# Patient Record
Sex: Male | Born: 2005 | Hispanic: No | Marital: Single | Smoking: Never smoker
Health system: Southern US, Community
[De-identification: ages and names within clinical notes are randomized; demographics above are authoritative.]

## PROBLEM LIST (undated history)

## (undated) DIAGNOSIS — F909 Attention-deficit hyperactivity disorder, unspecified type: Secondary | ICD-10-CM

## (undated) DIAGNOSIS — T4145XA Adverse effect of unspecified anesthetic, initial encounter: Secondary | ICD-10-CM

## (undated) DIAGNOSIS — F840 Autistic disorder: Secondary | ICD-10-CM

## (undated) DIAGNOSIS — T8859XA Other complications of anesthesia, initial encounter: Secondary | ICD-10-CM

## (undated) DIAGNOSIS — F32A Depression, unspecified: Secondary | ICD-10-CM

## (undated) DIAGNOSIS — J45909 Unspecified asthma, uncomplicated: Secondary | ICD-10-CM

## (undated) DIAGNOSIS — K5900 Constipation, unspecified: Secondary | ICD-10-CM

## (undated) HISTORY — PX: TONSILECTOMY, ADENOIDECTOMY, BILATERAL MYRINGOTOMY AND TUBES: SHX2538

## (undated) HISTORY — PX: STRABISMUS SURGERY: SHX218

## (undated) HISTORY — PX: OTHER SURGICAL HISTORY: SHX169

## (undated) HISTORY — DX: Depression, unspecified: F32.A

---
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
## 2023-02-26 ENCOUNTER — Ambulatory Visit (INDEPENDENT_AMBULATORY_CARE_PROVIDER_SITE_OTHER): Payer: Self-pay | Admitting: Psychiatry

## 2023-02-26 DIAGNOSIS — F840 Autistic disorder: Secondary | ICD-10-CM

## 2023-02-27 NOTE — Progress Notes (Signed)
No show

## 2023-06-10 ENCOUNTER — Encounter: Payer: Self-pay | Admitting: Physician Assistant

## 2023-06-10 ENCOUNTER — Ambulatory Visit: Payer: Self-pay | Admitting: Physician Assistant

## 2023-06-10 VITALS — BP 130/88 | HR 75 | Temp 98.2°F | Ht 65.25 in | Wt 126.4 lb

## 2023-06-10 DIAGNOSIS — R0989 Other specified symptoms and signs involving the circulatory and respiratory systems: Secondary | ICD-10-CM

## 2023-06-10 MED ORDER — IPRATROPIUM BROMIDE 0.03 % NA SOLN: 2.0000 | Freq: Two times a day (BID) | NASAL | 2 refills | Status: AC

## 2023-06-10 NOTE — Patient Instructions (Signed)
 It was great to see you!  Lets try a few things to help with your symptoms: Start regular use of over the counter antihistamines such as Zyrtec (cetirizine), Claritin (loratadine), Allegra (fexofenadine), or Xyzal (levocetirizine) daily. Take for 1-2 weeks. If no improvement, stop this, and trial OTC (available over the counter without a prescription) acid reducing medication such as Prilosec (omeprazole), Pepcid (famotidine). Take for 1-2 weeks.  If no improvement, stop this and trial nasal spray atrovent, that I have sent in.  In the meantime, lets go ahead and place referral to [facility] to follow-up on this issue and get you some relief if these options do not work out for you.  Take care,  Jarold Motto PA-C

## 2023-06-10 NOTE — Progress Notes (Signed)
 Todd Morris is a 18 y.o. male here for a new problem.  History of Present Illness:   Chief Complaint  Patient presents with   post nasal drip    Pt c/o post nasal drip x 3 years, worse the past 6 months, constantly having to clear his throat. Has not tried any medications.    HPI  Excessive throat clearing Patient complains of a a post nasal drip that has persisted for the past 3 years. He states that his symptoms resolved for sometime but has recurred and been worsening in the past 6 months.  Associated symptoms include often having to clear his throat and sore throat.   He has not tried any medications or using any sprays but does report drinking teas which seems to resolve his symptoms for a short time.   He states that his symptoms often prevents from sleeping throughout the night and often causes him discomfort throughout the day.  Believes that it could be caused by a behavioral tic due to his autism.  Denies any epigastric pain, heartburn, metallic taste, dysphagia or coughs. Denies suffering from seasonal allergies or having to take antihistamines regularly.   He has not followed up with his dentist in over a year but states that he doesn't have any cavities.   Past Medical History:  Diagnosis Date   ADHD (attention deficit hyperactivity disorder)    Autism    High functioning - explain to him what you are doing   Complication of anesthesia    1st Strabismus surgery - comp - lungs swollen with fluid - Age 66   Constipation    Depression      Social History   Tobacco Use   Smoking status: Never   Smokeless tobacco: Never  Vaping Use   Vaping status: Never Used  Substance Use Topics   Alcohol use: Never   Drug use: Never    Past Surgical History:  Procedure Laterality Date   left ear patched     hole in ear from tubes   STRABISMUS SURGERY     at age 1   STRABISMUS SURGERY Left 03/12/2018   Procedure: LEFT EYE STRABISMUS REPAIR PEDIATRIC;  Surgeon: Verne Carrow, MD;  Location: [facility];  Service: Ophthalmology;  Laterality: Left;   TONSILECTOMY, ADENOIDECTOMY, BILATERAL MYRINGOTOMY AND TUBES     age 21 1/20 years old, reduced size of tonsils   TYMPANOSTOMY  2012   WISDOM TOOTH EXTRACTION  2023    Family History  Adopted: Yes  Problem Relation Age of Onset   Drug abuse Mother    Asthma Father     Allergies  Allergen Reactions   Penicillins Rash    Current Medications:   Current Outpatient Medications:    hydrocortisone 2.5 % cream, SMARTSIG:sparingly Topical Daily, Disp: , Rfl:    tacrolimus (PROTOPIC) 0.03 % ointment, Apply topically 2 (two) times daily as needed., Disp: , Rfl:    Review of Systems:   Review of Systems  HENT:  Positive for sore throat.        +hoarseness +post nasal drip +throat clearing  Respiratory:  Negative for cough.   Gastrointestinal:  Negative for abdominal pain and heartburn.       -dysphagia   Negative unless otherwise specified per HPI.  Vitals:   Vitals:   06/10/23 1134  BP: 130/88  Pulse: 75  Temp: 98.2 F (36.8 C)  TempSrc: Temporal  SpO2: 98%  Weight: 126 lb 6.1 oz (57.3 kg)  Height: 5' 5.25" (1.657 m)     Body mass index is 20.87 kg/m.  Physical Exam:   Physical Exam Vitals (frequent clearing of throat throughout encounter) and nursing note reviewed.  Constitutional:      General: He is not in acute distress.    Appearance: He is well-developed. He is not ill-appearing or toxic-appearing.  HENT:     Head: Normocephalic and atraumatic.     Right Ear: Tympanic membrane, ear canal and external ear normal. Tympanic membrane is not erythematous, retracted or bulging.     Left Ear: Tympanic membrane, ear canal and external ear normal. Tympanic membrane is not erythematous, retracted or bulging.     Nose: Nose normal.     Right Sinus: No maxillary sinus tenderness or frontal sinus tenderness.     Left Sinus: No maxillary sinus tenderness or frontal sinus  tenderness.     Mouth/Throat:     Pharynx: Uvula midline. No posterior oropharyngeal erythema.  Eyes:     General: Lids are normal.     Conjunctiva/sclera: Conjunctivae normal.     Pupils: Pupils are equal, round, and reactive to light.  Neck:     Trachea: Trachea normal.  Cardiovascular:     Rate and Rhythm: Normal rate and regular rhythm.     Heart sounds: Normal heart sounds, S1 normal and S2 normal.  Pulmonary:     Effort: Pulmonary effort is normal.     Breath sounds: Normal breath sounds. No decreased breath sounds, wheezing, rhonchi or rales.  Musculoskeletal:        General: Normal range of motion.     Cervical back: Normal range of motion.  Lymphadenopathy:     Cervical: No cervical adenopathy.  Skin:    General: Skin is warm and dry.  Neurological:     Mental Status: He is alert and oriented to person, place, and time.  Psychiatric:        Speech: Speech normal.        Behavior: Behavior normal. Behavior is cooperative.        Thought Content: Thought content normal.        Judgment: Judgment normal.     Assessment and Plan:   Chronic throat clearing Unclear etiology We will attempt to treat as postnasal drip and/or reflux Advised as follows: Lets try a few things to help with your symptoms: Start regular use of over the counter antihistamines such as Zyrtec (cetirizine), Claritin (loratadine), Allegra (fexofenadine), or Xyzal (levocetirizine) daily. Take for 1-2 weeks. If no improvement, stop this, and trial OTC (available over the counter without a prescription) acid reducing medication such as Prilosec (omeprazole), Pepcid (famotidine). Take for 1-2 weeks.  If no improvement, stop this and trial nasal spray atrovent, that I have sent in.  In the meantime, lets go ahead and place referral to [facility], [facility] Th[facility] to follow-up on this issue and get you some relief if these options do not work out for you.    Jarold Motto, PA-C  I,Safa M Kadhim,acting as a  scribe for Jarold Motto, PA.,have documented all relevant documentation on the behalf of Jarold Motto, PA,as directed by  Jarold Motto, PA while in the presence of Jarold Motto, [place].   I, Jarold Motto, [place], have reviewed all documentation for this visit. The documentation on 06/10/23 for the exam, diagnosis, procedures, and orders are all accurate and complete.

## 2023-08-11 ENCOUNTER — Telehealth (INDEPENDENT_AMBULATORY_CARE_PROVIDER_SITE_OTHER): Payer: Self-pay | Admitting: Otolaryngology

## 2023-08-11 NOTE — Telephone Encounter (Signed)
 I left a voicemail with the new address for Dr. Soldatova.

## 2023-08-13 ENCOUNTER — Institutional Professional Consult (permissible substitution) (INDEPENDENT_AMBULATORY_CARE_PROVIDER_SITE_OTHER): Admitting: Otolaryngology
